# Patient Record
Sex: Female | Born: 1955 | Race: White | Hispanic: No | Marital: Married | Smoking: Never smoker
Health system: Southern US, Community
[De-identification: ages and names within clinical notes are randomized; demographics above are authoritative.]

## PROBLEM LIST (undated history)

## (undated) DIAGNOSIS — E119 Type 2 diabetes mellitus without complications: Secondary | ICD-10-CM

## (undated) DIAGNOSIS — F410 Panic disorder [episodic paroxysmal anxiety] without agoraphobia: Secondary | ICD-10-CM

## (undated) DIAGNOSIS — I10 Essential (primary) hypertension: Secondary | ICD-10-CM

---

## 2017-02-22 ENCOUNTER — Emergency Department (HOSPITAL_COMMUNITY): Payer: BLUE CROSS/BLUE SHIELD

## 2017-02-22 ENCOUNTER — Encounter (HOSPITAL_COMMUNITY): Payer: Self-pay

## 2017-02-22 ENCOUNTER — Emergency Department (HOSPITAL_COMMUNITY)
Admission: EM | Admit: 2017-02-22 | Discharge: 2017-02-23 | Disposition: A | Discharge status: Home / Self Care | Payer: BLUE CROSS/BLUE SHIELD | Attending: Emergency Medicine | Admitting: Emergency Medicine

## 2017-02-22 DIAGNOSIS — R42 Dizziness and giddiness: Secondary | ICD-10-CM

## 2017-02-22 DIAGNOSIS — E119 Type 2 diabetes mellitus without complications: Secondary | ICD-10-CM | POA: Diagnosis not present

## 2017-02-22 DIAGNOSIS — Z79899 Other long term (current) drug therapy: Secondary | ICD-10-CM | POA: Insufficient documentation

## 2017-02-22 DIAGNOSIS — R55 Syncope and collapse: Secondary | ICD-10-CM

## 2017-02-22 DIAGNOSIS — Z7984 Long term (current) use of oral hypoglycemic drugs: Secondary | ICD-10-CM | POA: Insufficient documentation

## 2017-02-22 DIAGNOSIS — I10 Essential (primary) hypertension: Secondary | ICD-10-CM

## 2017-02-22 HISTORY — DX: Type 2 diabetes mellitus without complications: E11.9

## 2017-02-22 HISTORY — DX: Panic disorder (episodic paroxysmal anxiety): F41.0

## 2017-02-22 HISTORY — DX: Essential (primary) hypertension: I10

## 2017-02-22 LAB — BASIC METABOLIC PANEL
Anion gap: 11 (ref 5–15)
BUN: 20 mg/dL (ref 6–20)
CHLORIDE: 100 mmol/L — AB (ref 101–111)
CO2: 23 mmol/L (ref 22–32)
Calcium: 9.5 mg/dL (ref 8.9–10.3)
Creatinine, Ser: 1.15 mg/dL — ABNORMAL HIGH (ref 0.44–1.00)
GFR, EST AFRICAN AMERICAN: 58 mL/min — AB (ref >60)
GFR, EST NON AFRICAN AMERICAN: 50 mL/min — AB (ref >60)
Glucose, Bld: 200 mg/dL — ABNORMAL HIGH (ref 65–99)
POTASSIUM: 4.1 mmol/L (ref 3.5–5.1)
SODIUM: 134 mmol/L — AB (ref 135–145)

## 2017-02-22 LAB — CBC
HEMATOCRIT: 36.4 % (ref 36.0–46.0)
Hemoglobin: 12.5 g/dL (ref 12.0–15.0)
MCH: 30.9 pg (ref 26.0–34.0)
MCHC: 34.3 g/dL (ref 30.0–36.0)
MCV: 89.9 fL (ref 78.0–100.0)
PLATELETS: 232 10*3/uL (ref 150–400)
RBC: 4.05 MIL/uL (ref 3.87–5.11)
RDW: 11.9 % (ref 11.5–15.5)
WBC: 11.6 10*3/uL — AB (ref 4.0–10.5)

## 2017-02-22 MED ORDER — HYDRALAZINE HCL 20 MG/ML IJ SOLN
5.0000 mg | INTRAMUSCULAR | Status: AC
  Administered 2017-02-22: 5 mg via INTRAVENOUS
  Filled 2017-02-22: qty 1

## 2017-02-22 MED ORDER — SODIUM CHLORIDE 0.9 % IV BOLUS (SEPSIS)
1000.0000 mL | Freq: Once | INTRAVENOUS | Status: AC
Start: 2017-02-22 — End: 2017-02-23
  Administered 2017-02-22: 1000 mL via INTRAVENOUS

## 2017-02-22 MED ORDER — SODIUM CHLORIDE 0.9 % IV SOLN
1000.0000 mL | INTRAVENOUS | Status: DC
  Administered 2017-02-22: 1000 mL via INTRAVENOUS

## 2017-02-22 MED ORDER — MECLIZINE HCL 25 MG PO TABS: 50.0000 mg | ORAL_TABLET | Freq: Once | ORAL | Status: DC

## 2017-02-22 MED ORDER — MECLIZINE HCL 25 MG PO TABS
25.0000 mg | ORAL_TABLET | Freq: Once | ORAL | Status: AC
  Administered 2017-02-22: 25 mg via ORAL
  Filled 2017-02-22: qty 1

## 2017-02-22 NOTE — ED Triage Notes (Signed)
Pt comes via [facility] for a near syncopal episode, pt was in her hotel putting bags away and bent over and became very dizzy. Pt has been off BP meds for two weeks due to allergic reaction to lisinopril. Pt also had large diarrhea episode prior to dizziness episode.

## 2017-02-22 NOTE — ED Provider Notes (Signed)
[facility]-EMERGENCY DEPT Provider Note   CSN: [id number] Arrival date & time: 02/22/17  2142     History   Chief Complaint Chief Complaint  Patient presents with  . Near Syncope    HPI Linda Hatfield is a 61 y.o. female with a hx of NIDDM, HTN, anxiety presents to the Emergency Department complaining of acute, sudden near syncopal episode onset just PTA.  Pt reports she was in her hotel room, putting bags away when she bent over and became very dizzy. Pt reports the dizziness is like the inside of her head is spinning.  She reports vision changes of "closing in" which has resolved.  She did not fall or hit her head.  No complete LOC.  No hx of similar episodes.  She reports she was taken off her lisinopril 2 weeks ago after an allergic reaction described as difficulty breathing and rash. She was not restarted on medication for HTN.  No known aggravating or alleviating factors.  Pt denies fever, chills, recent travel, leg swelling, CP, SOB, nausea or vomiting. Pt reports chronic sinus congestion, but no recent illness.    The history is provided by the patient and medical records. No language interpreter was used.    Past Medical History:  Diagnosis Date  . Diabetes mellitus without complication (HCC)   . Hypertension   . Panic attacks     There are no active problems to display for this patient.   History reviewed. No pertinent surgical history.  OB History    No data available       Home Medications    Prior to Admission medications   Medication Sig Start Date End Date Taking? Authorizing Provider  Amino Acids (AMINO ACID PO) Take 1 capsule by mouth as needed (panic attack).   Yes [provider]  aspirin EC 325 MG tablet Take 325 mg by mouth as needed (stroke symptoms).   Yes [provider]  Cholecalciferol (VITAMIN D PO) Take 1 capsule by mouth daily.   Yes [provider]  Coenzyme Q10 (COQ-10 PO) Take 1 capsule by mouth daily.   Yes [provider]  glyBURIDE (DIABETA) 5 MG tablet Take 5 mg by mouth 2 (two) times daily.   Yes [provider]  ibuprofen (ADVIL,MOTRIN) 200 MG tablet Take 400 mg by mouth every 6 (six) hours as needed for headache or mild pain.   Yes [provider]  MAGNESIUM PO Take 1 capsule by mouth daily.   Yes [provider]  metFORMIN (GLUCOPHAGE) 500 MG tablet Take 1,000 mg by mouth 2 (two) times daily with a meal.   Yes [provider]  pravastatin (PRAVACHOL) 40 MG tablet Take 40 mg by mouth daily.   Yes [provider]  sodium-potassium bicarbonate (ALKA-SELTZER GOLD) TBEF dissolvable tablet Take 1 tablet by mouth daily as needed (sinuses).   Yes [provider]  hydrochlorothiazide (HYDRODIURIL) 12.5 MG tablet Take 1 tablet (12.5 mg total) by mouth daily. 02/23/17   Montrey Buist, Dahlia Client, PA-C  meclizine (ANTIVERT) 25 MG tablet Take 1 tablet (25 mg total) by mouth 3 (three) times daily as needed for dizziness. 02/23/17   Syeda Prickett, Dahlia Client, PA-C    Family History No family history on file.  Social History Social History  Substance Use Topics  . Smoking status: Never Smoker  . Smokeless tobacco: Never Used  . Alcohol use No     Allergies   Lisinopril and Azithromycin   Review of Systems Review of Systems  Constitutional: Negative for appetite change, diaphoresis, fatigue, fever and unexpected weight change.  HENT: Negative for mouth sores.   Eyes: Negative for visual disturbance.  Respiratory: Negative for cough, chest tightness, shortness of breath and wheezing.   Cardiovascular: Positive for near-syncope. Negative for chest pain.  Gastrointestinal: Negative for abdominal pain, constipation, diarrhea, nausea and vomiting.  Endocrine: Negative for polydipsia, polyphagia and polyuria.  Genitourinary: Negative for dysuria, frequency, hematuria and urgency.  Musculoskeletal: Negative for back pain and neck stiffness.  Skin: Negative  for rash.  Allergic/Immunologic: Negative for immunocompromised state.  Neurological: Positive for syncope ( near syncope). Negative for light-headedness and headaches.  Hematological: Does not bruise/bleed easily.  Psychiatric/Behavioral: Negative for sleep disturbance. The patient is not nervous/anxious.   All other systems reviewed and are negative.    Physical Exam Updated Vital Signs BP (!) 170/86   Pulse 83   Temp 98.3 F (36.8 C) (Oral)   Resp 20   Ht 5\' 8"  (1.727 m)   Wt 89.8 kg (198 lb)   SpO2 100%   BMI 30.11 kg/m   Physical Exam  Constitutional: She is oriented to person, place, and time. She appears well-developed and well-nourished. No distress.  Awake, alert, nontoxic appearance  HENT:  Head: Normocephalic and atraumatic.  Mouth/Throat: Oropharynx is clear and moist. No oropharyngeal exudate.  Eyes: Pupils are equal, round, and reactive to light. Conjunctivae and EOM are normal. No scleral icterus.  No horizontal, vertical or rotational nystagmus  Neck: Normal range of motion. Neck supple.  Full active and passive ROM without pain No midline or paraspinal tenderness No nuchal rigidity or meningeal signs  Cardiovascular: Normal rate, regular rhythm and intact distal pulses.   Pulmonary/Chest: Effort normal and breath sounds normal. No respiratory distress. She has no wheezes. She has no rales.  Equal chest expansion  Abdominal: Soft. Bowel sounds are normal. She exhibits no mass. There is no tenderness. There is no rebound and no guarding.  Musculoskeletal: Normal range of motion. She exhibits no edema.  Lymphadenopathy:    She has no cervical adenopathy.  Neurological: She is alert and oriented to person, place, and time. No cranial nerve deficit. She exhibits normal muscle tone. Coordination normal.  Mental Status:  Alert, oriented, thought content appropriate. Speech fluent without evidence of aphasia. Able to follow 2 step commands without difficulty.    Cranial Nerves:  II:  Peripheral visual fields grossly normal, pupils equal, round, reactive to light III,IV, VI: ptosis not present, extra-ocular motions intact bilaterally  V,VII: smile symmetric, facial light touch sensation equal VIII: hearing grossly normal bilaterally  IX,X: midline uvula rise  XI: bilateral shoulder shrug equal and strong XII: midline tongue extension  Motor:  5/5 in upper and lower extremities bilaterally including strong and equal grip strength and dorsiflexion/plantar flexion Sensory: Pinprick and light touch normal in all extremities.  Cerebellar: normal finger-to-nose with bilateral upper extremities Gait: gait testing deferred CV: distal pulses palpable throughout   Skin: Skin is warm and dry. No rash noted. She is not diaphoretic.  Psychiatric: She has a normal mood and affect. Her behavior is normal. Judgment and thought content normal.  Nursing note and vitals reviewed.    ED Treatments / Results  Labs (all labs ordered are listed, but only abnormal results are displayed) Labs Reviewed  BASIC METABOLIC PANEL - Abnormal; Notable for the following:       Result Value   Sodium 134 (*)    Chloride 100 (*)    Glucose,  Bld 200 (*)    Creatinine, Ser 1.15 (*)    GFR calc non Af Amer 50 (*)    GFR calc Af Amer 58 (*)    All other components within normal limits  CBC - Abnormal; Notable for the following:    WBC 11.6 (*)    All other components within normal limits  URINALYSIS, ROUTINE W REFLEX MICROSCOPIC - Abnormal; Notable for the following:    Color, Urine STRAW (*)    Glucose, UA 150 (*)    All other components within normal limits  CBG MONITORING, ED - Abnormal; Notable for the following:    Glucose-Capillary 207 (*)    All other components within normal limits  I-STAT TROPONIN, ED    EKG  EKG Interpretation  Date/Time:  Friday February 22 2017 21:54:42 EDT Ventricular Rate:  83 PR Interval:    QRS Duration: 102 QT Interval:  388 QTC  Calculation: 456 R Axis:   58 Text Interpretation:  Sinus rhythm No previous tracing Confirmed by Cathren LaineSteinl, Kevin ([id number]) on 02/22/2017 11:10:27 PM       Radiology Ct Head Wo Contrast  Result Date: 02/22/2017 CLINICAL DATA:  Acute onset of syncope.  Initial encounter. EXAM: CT HEAD WITHOUT CONTRAST TECHNIQUE: Contiguous axial images were obtained from the base of the skull through the vertex without intravenous contrast. COMPARISON:  None. FINDINGS: Brain: No evidence of acute infarction, hemorrhage, hydrocephalus, extra-axial collection or mass lesion/mass effect. Prominence of the ventricles and sulci reflects mild to moderate cortical volume loss. Mild periventricular white matter change likely reflects small vessel ischemic microangiopathy. A small chronic lacunar infarct is noted at the left basal ganglia. The brainstem and fourth ventricle are within normal limits. The cerebral hemispheres demonstrate grossly normal gray-white differentiation. No mass effect or midline shift is seen. Vascular: No hyperdense vessel or unexpected calcification. Skull: There is no evidence of fracture; visualized osseous structures are unremarkable in appearance. Sinuses/Orbits: The orbits are within normal limits. The paranasal sinuses and mastoid air cells are well-aerated. Other: No significant soft tissue abnormalities are seen. IMPRESSION: 1. No acute intracranial pathology seen on CT. 2. Mild to moderate cortical volume loss and scattered small vessel ischemic microangiopathy. 3. 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 Electronically Signed   By: Roanna RaiderJeffery  Chang M.D.   On: 02/22/2017 23:25   Mr Brain Wo Contrast  Result Date: 02/23/2017 CLINICAL DATA:  Near-syncope.  History of hypertension and diabetes. EXAM: MRI HEAD WITHOUT CONTRAST TECHNIQUE: Multiplanar, multiecho pulse sequences of the brain and surrounding structures were obtained without intravenous contrast. COMPARISON:  CT HEAD February 22, 2017 FINDINGS: BRAIN: No reduced diffusion to suggest acute ischemia. No susceptibility artifact to suggest hemorrhage. Mild ventriculomegaly on the basis of global parenchymal brain volume loss as there is overall commensurate enlargement of cerebral sulci and cerebellar folia. Scattered subcentimeter supratentorial white matter FLAIR T2 hyperintensities. No suspicious parenchymal signal, mass or mass effect. No abnormal extra-axial fluid collections. VASCULAR: Normal major intracranial vascular flow voids present at skull base. SKULL AND UPPER CERVICAL SPINE: No abnormal sellar expansion. No suspicious calvarial bone marrow signal. Craniocervical junction maintained. SINUSES/ORBITS: The mastoid air-cells and included paranasal sinuses are well-aerated. The included ocular globes and orbital contents are non-suspicious. OTHER: None. IMPRESSION: 1. No acute intracranial process. 2. Mild parenchymal brain volume loss for age. 3. Mild chronic small vessel ischemic disease. Electronically Signed   By: Awilda Metroourtnay  Bloomer M.D.   On: 02/23/2017 01:24    Procedures Procedures (including  critical care time)  Medications Ordered in ED Medications  sodium chloride 0.9 % bolus 1,000 mL (0 mLs Intravenous Stopped 02/23/17 0037)    Followed by  0.9 %  sodium chloride infusion (1,000 mLs Intravenous New Bag/Given 02/22/17 2337)  meclizine (ANTIVERT) tablet 25 mg (25 mg Oral Given 02/22/17 2329)  hydrALAZINE (APRESOLINE) injection 5 mg (5 mg Intravenous Given 02/22/17 2337)  LORazepam (ATIVAN) injection 1 mg (1 mg Intravenous Given 02/23/17 0213)     Initial Impression / Assessment and Plan / ED Course  I have reviewed the triage vital signs and the nursing notes.  Pertinent labs & imaging results that were available during my care of the patient were reviewed by me and considered in my medical decision making (see chart for details).  Clinical Course as of Feb 23 445  Caleen Essex Feb 22, 2017  2331 Elevated,  unknown baseline Creatinine: (!) 1.15 [HM]  2333 Mild leukocytosis WBC: (!) 11.6 [HM]  2333 HTN noted BP: (!) 163/81 [HM]  2334 No tachycardia Pulse Rate: 78 [HM]    Clinical Course User Index [HM] Jalaine Riggenbach, Dahlia Client, PA-C    Patient presents with acute onset dizziness with near syncope. She has no history of symptoms prior. CT and MRI without evidence of acute CVA. Patient given meclizine with significant improvement in symptoms. She was able to ambulate with steady gait to the bathroom and then her symptoms return. She was subsequently given Ativan with additional resolution. Consult time she has been ambulatory in the emergency department without difficulty. Patient has slightly elevated glucose but no evidence of DKA. Creatinine is also elevated at 1.15 however unknown baseline as patient is not from here.  Patient somewhat hypertensive and also anxious on arrival. She has not been taking her lisinopril. Blood pressure decreased here in the emergency department. She will be discharged home on HCTZ. She is to follow with her primary care physician for further evaluation of this and her serum creatinine. She has no chest pain or shortness of breath. EKG reassuring.   The patient was discussed with and seen by Dr. Denton Lank who agrees with the treatment plan.   Final Clinical Impressions(s) / ED Diagnoses   Final diagnoses:  Dizziness  Near syncope  Essential hypertension    New Prescriptions New Prescriptions   HYDROCHLOROTHIAZIDE (HYDRODIURIL) 12.5 MG TABLET    Take 1 tablet (12.5 mg total) by mouth daily.   MECLIZINE (ANTIVERT) 25 MG TABLET    Take 1 tablet (25 mg total) by mouth 3 (three) times daily as needed for dizziness.     Arnitra Sokoloski, Boyd Kerbs 02/23/17 0449    Cathren Laine, MD 02/23/17 [phone]

## 2017-02-22 NOTE — ED Notes (Signed)
Patient transported to CT 

## 2017-02-23 ENCOUNTER — Emergency Department (HOSPITAL_COMMUNITY): Payer: BLUE CROSS/BLUE SHIELD

## 2017-02-23 LAB — URINALYSIS, ROUTINE W REFLEX MICROSCOPIC
BILIRUBIN URINE: NEGATIVE
GLUCOSE, UA: 150 mg/dL — AB
HGB URINE DIPSTICK: NEGATIVE
KETONES UR: NEGATIVE mg/dL
Leukocytes, UA: NEGATIVE
NITRITE: NEGATIVE
PH: 7 (ref 5.0–8.0)
Protein, ur: NEGATIVE mg/dL
SPECIFIC GRAVITY, URINE: 1.006 (ref 1.005–1.030)

## 2017-02-23 LAB — CBG MONITORING, ED: Glucose-Capillary: 207 mg/dL — ABNORMAL HIGH (ref 65–99)

## 2017-02-23 MED ORDER — HYDROCHLOROTHIAZIDE 12.5 MG PO TABS: 12.5000 mg | ORAL_TABLET | Freq: Every day | ORAL | 1 refills | Status: AC

## 2017-02-23 MED ORDER — MECLIZINE HCL 25 MG PO TABS: 25.0000 mg | ORAL_TABLET | Freq: Three times a day (TID) | ORAL | 0 refills | Status: AC | PRN

## 2017-02-23 MED ORDER — LORAZEPAM 2 MG/ML IJ SOLN
1.0000 mg | Freq: Once | INTRAMUSCULAR | Status: AC
  Administered 2017-02-23: 1 mg via INTRAVENOUS
  Filled 2017-02-23: qty 1

## 2017-02-23 NOTE — ED Notes (Signed)
Pt walked in hallway to bathroom with assistance, pt lost balance twice and stated she still felt "off balance"

## 2017-02-23 NOTE — Discharge Instructions (Signed)
1. Medications: Meclizine for dizziness, HCTZ for high blood pressure, usual home medications 2. Treatment: rest, drink plenty of fluids, start Flonase and decongestants 3. Follow Up: Please followup with your primary doctor at your upcoming appointment for discussion of your diagnoses and further evaluation after today's visit; please also follow-up with your present throat for ear dizziness symptoms; Please return to the ER for new or worsening symptoms, total syncope.

## 2017-02-23 NOTE — ED Notes (Signed)
Pt ambulated in hallway with steady gait.

## 2018-01-29 IMAGING — CT CT HEAD W/O CM
3 of 4 series · 13 of 47 positions shown, 15 images · non-contrast
Comparison: None.

CLINICAL DATA: Acute onset of syncope.  Initial encounter.

EXAM:
CT HEAD WITHOUT CONTRAST
TECHNIQUE: Contiguous axial images were obtained from the base of the skull
through the vertex without intravenous contrast.

[Series 3: head without · axial · non-contrast · 0.39mm/px · z∈[-599,-479]mm · 7 of 33 slices shown, 9 images]
[im 5/33  brain]
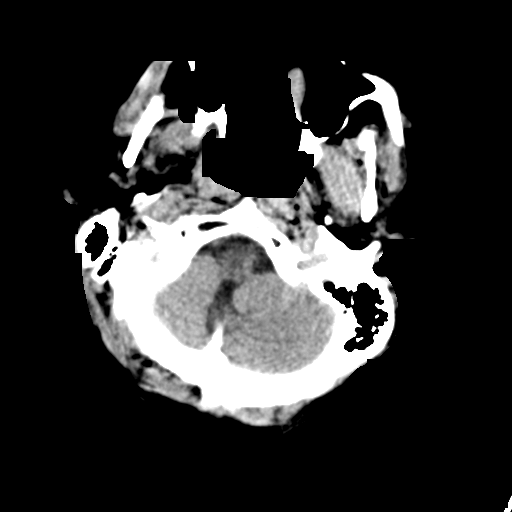
[im 5/33  bone]
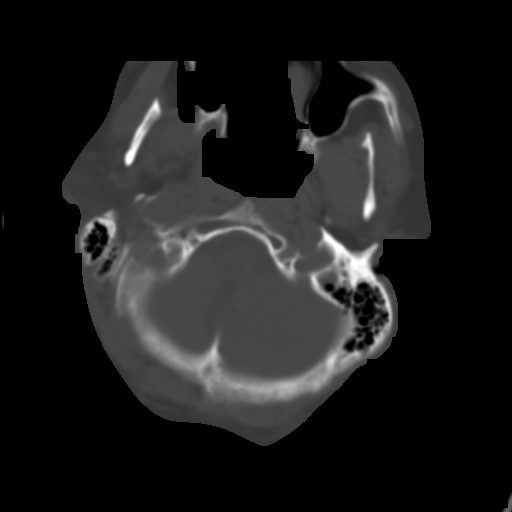
[im 9/33  brain]
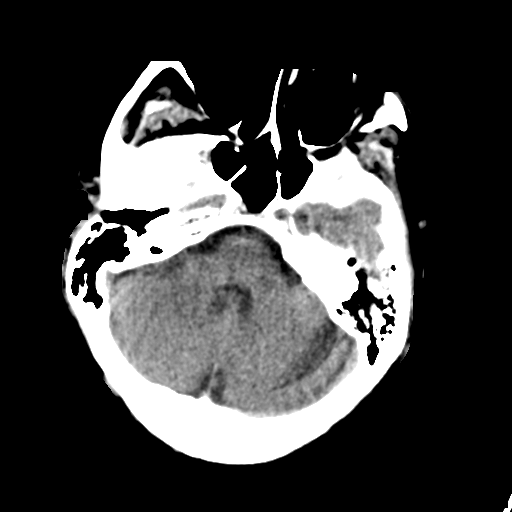
[im 13/33  brain]
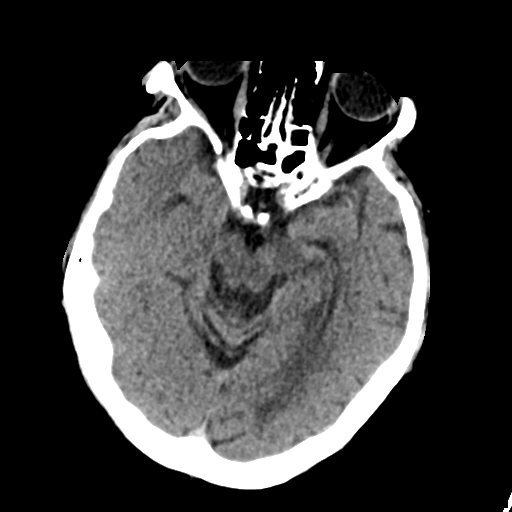
[im 17/33  brain]
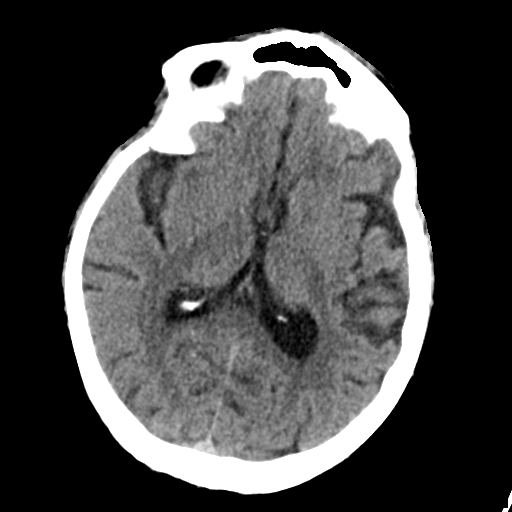
[im 21/33  brain]
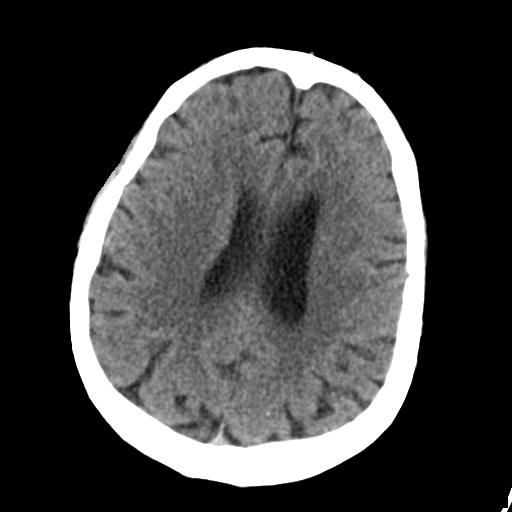
[im 21/33  bone]
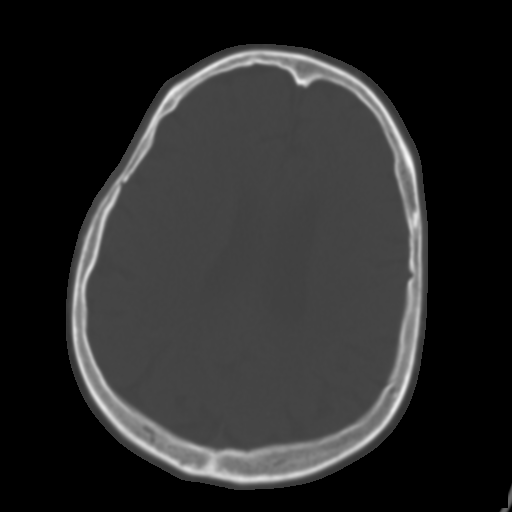
[im 25/33  brain]
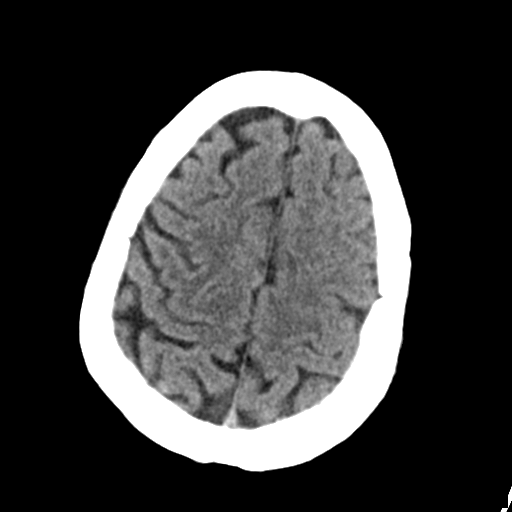
[im 29/33  brain]
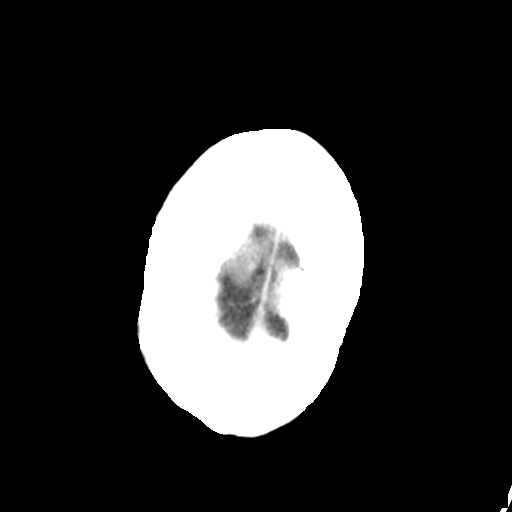

[Series 5: head without cor · coronal · non-contrast · 0.32mm/px · 3 of 68 slices shown]
[im 23/68  brain]
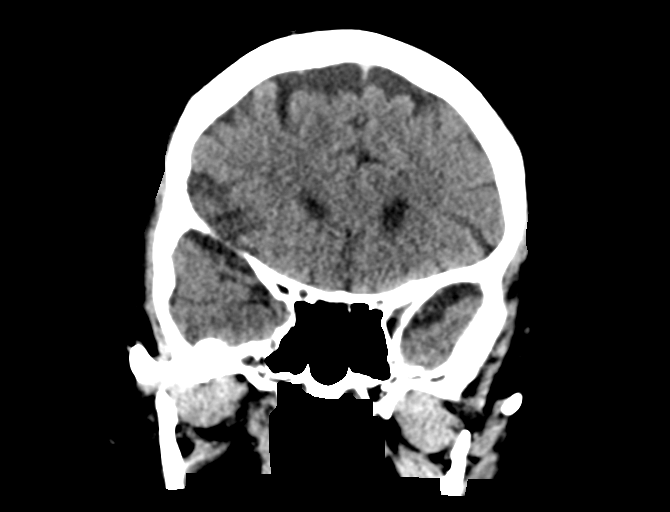
[im 30/68  brain]
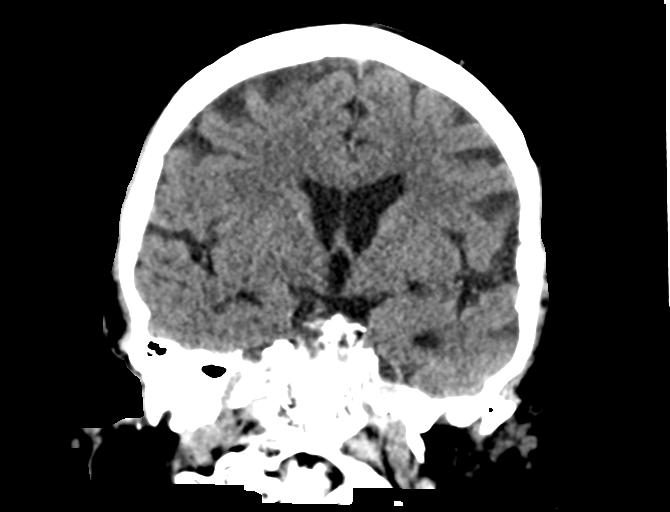
[im 38/68  brain]
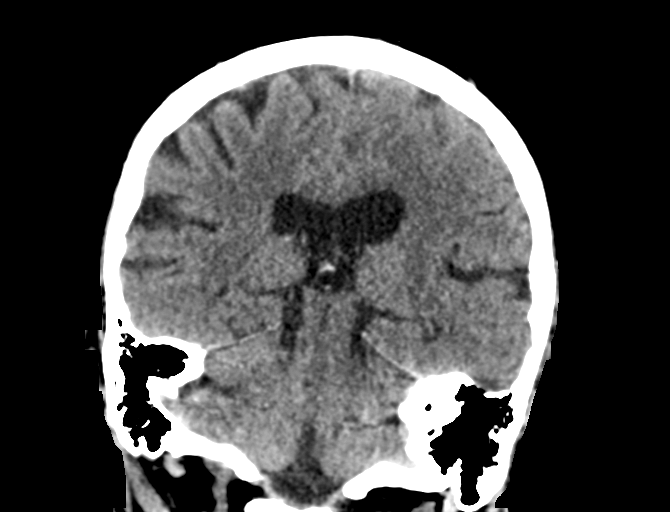

[Series 6: head without sag · sagittal · non-contrast · 0.32mm/px · 3 of 67 slices shown]
[im 23/67  brain]
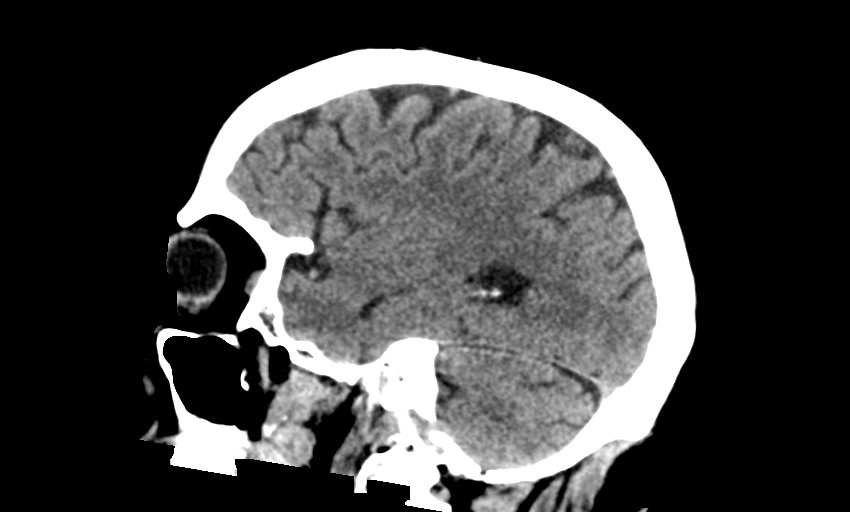
[im 34/67  brain]
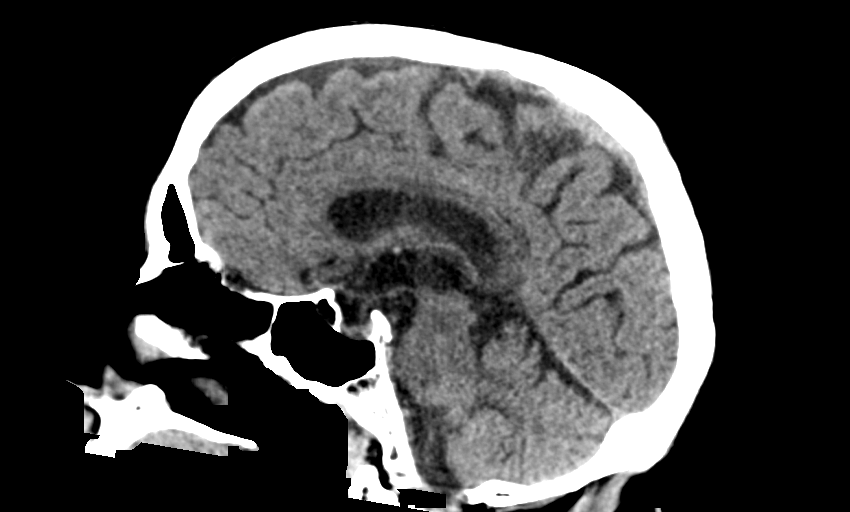
[im 45/67  brain]
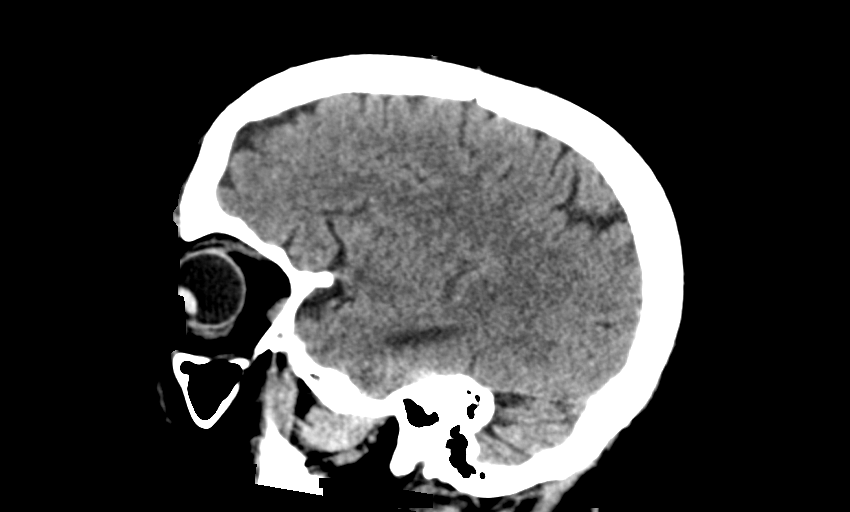

[13 of 47 positions shown; findings below may reference images not displayed]

FINDINGS: Brain: No evidence of acute infarction, hemorrhage, hydrocephalus,
extra-axial collection or mass lesion/mass effect.

Prominence of the ventricles and sulci reflects mild to moderate
cortical volume loss. Mild periventricular white matter change
likely reflects small vessel ischemic microangiopathy. A small
chronic lacunar infarct is noted at the left basal ganglia.

The brainstem and fourth ventricle are within normal limits. The
cerebral hemispheres demonstrate grossly normal gray-white
differentiation. No mass effect or midline shift is seen.

Vascular: No hyperdense vessel or unexpected calcification.

Skull: There is no evidence of fracture; visualized osseous
structures are unremarkable in appearance.

Sinuses/Orbits: The orbits are within normal limits. The paranasal
sinuses and mastoid air cells are well-aerated.

Other: No significant soft tissue abnormalities are seen.
IMPRESSION: 1. No acute intracranial pathology seen on CT.
2. Mild to moderate cortical volume loss and scattered small vessel
ischemic microangiopathy.
3. Small chronic lacunar infarct at the left basal ganglia.
